# Patient Record
Sex: Female | Born: 1960 | Race: Black or African American | Hispanic: No | Marital: Single | Smoking: Never smoker
Health system: Southern US, Community
[De-identification: ages and names within clinical notes are randomized; demographics above are authoritative.]

## PROBLEM LIST (undated history)

## (undated) DIAGNOSIS — I10 Essential (primary) hypertension: Secondary | ICD-10-CM

## (undated) DIAGNOSIS — E119 Type 2 diabetes mellitus without complications: Secondary | ICD-10-CM

---

## 2005-04-21 ENCOUNTER — Ambulatory Visit: Payer: Self-pay | Admitting: General Practice

## 2006-04-21 ENCOUNTER — Ambulatory Visit: Payer: Self-pay | Admitting: General Practice

## 2007-04-27 ENCOUNTER — Ambulatory Visit: Payer: Self-pay | Admitting: Endocrinology

## 2008-06-22 ENCOUNTER — Ambulatory Visit: Payer: Self-pay | Admitting: Endocrinology

## 2008-12-17 ENCOUNTER — Emergency Department: Payer: Self-pay | Admitting: Internal Medicine

## 2009-08-11 ENCOUNTER — Ambulatory Visit: Payer: Self-pay | Admitting: Family Medicine

## 2009-12-27 ENCOUNTER — Ambulatory Visit: Payer: Self-pay | Admitting: Family Medicine

## 2010-08-13 ENCOUNTER — Ambulatory Visit: Payer: Self-pay | Admitting: Internal Medicine

## 2010-09-10 ENCOUNTER — Ambulatory Visit: Payer: Self-pay | Admitting: Internal Medicine

## 2010-10-10 ENCOUNTER — Ambulatory Visit: Payer: Self-pay | Admitting: Internal Medicine

## 2011-01-02 ENCOUNTER — Ambulatory Visit: Payer: Self-pay | Admitting: Family Medicine

## 2011-07-12 ENCOUNTER — Ambulatory Visit: Payer: Self-pay | Admitting: Internal Medicine

## 2011-07-14 ENCOUNTER — Ambulatory Visit: Payer: Self-pay

## 2011-07-16 ENCOUNTER — Ambulatory Visit: Payer: Self-pay | Admitting: Family Medicine

## 2011-07-18 ENCOUNTER — Ambulatory Visit: Payer: Self-pay

## 2012-01-09 ENCOUNTER — Ambulatory Visit: Payer: Self-pay

## 2012-04-01 ENCOUNTER — Ambulatory Visit: Payer: Self-pay | Admitting: Medical

## 2014-01-31 ENCOUNTER — Ambulatory Visit: Payer: Self-pay | Admitting: Internal Medicine

## 2014-02-08 ENCOUNTER — Ambulatory Visit: Payer: Self-pay | Admitting: Internal Medicine

## 2014-02-28 ENCOUNTER — Ambulatory Visit: Payer: Self-pay | Admitting: Family Medicine

## 2014-03-10 ENCOUNTER — Ambulatory Visit: Payer: Self-pay | Admitting: Internal Medicine

## 2014-12-15 ENCOUNTER — Ambulatory Visit: Payer: Self-pay | Admitting: Gastroenterology

## 2016-04-15 ENCOUNTER — Other Ambulatory Visit: Payer: Self-pay | Admitting: Family Medicine

## 2016-04-15 DIAGNOSIS — Z1231 Encounter for screening mammogram for malignant neoplasm of breast: Secondary | ICD-10-CM

## 2017-03-11 ENCOUNTER — Ambulatory Visit
Admission: RE | Admit: 2017-03-11 | Discharge: 2017-03-11 | Disposition: A | Discharge status: Home / Self Care | Payer: BLUE CROSS/BLUE SHIELD | Source: Ambulatory Visit | Attending: Family Medicine | Admitting: Family Medicine

## 2017-03-11 ENCOUNTER — Encounter: Payer: Self-pay | Admitting: Radiology

## 2017-03-11 DIAGNOSIS — Z1231 Encounter for screening mammogram for malignant neoplasm of breast: Secondary | ICD-10-CM | POA: Diagnosis not present

## 2018-04-26 ENCOUNTER — Other Ambulatory Visit: Payer: Self-pay | Admitting: Family Medicine

## 2018-04-26 ENCOUNTER — Other Ambulatory Visit: Payer: Self-pay | Admitting: Nurse Practitioner

## 2019-09-12 ENCOUNTER — Other Ambulatory Visit: Payer: Self-pay | Admitting: Gerontology

## 2019-09-12 DIAGNOSIS — Z1231 Encounter for screening mammogram for malignant neoplasm of breast: Secondary | ICD-10-CM

## 2019-12-16 ENCOUNTER — Ambulatory Visit
Admission: EM | Admit: 2019-12-16 | Discharge: 2019-12-16 | Disposition: A | Discharge status: Home / Self Care | Payer: BC Managed Care – PPO | Attending: Family Medicine | Admitting: Family Medicine

## 2019-12-16 ENCOUNTER — Other Ambulatory Visit: Payer: Self-pay

## 2019-12-16 ENCOUNTER — Encounter: Payer: Self-pay | Admitting: Emergency Medicine

## 2019-12-16 DIAGNOSIS — Z20822 Contact with and (suspected) exposure to covid-19: Secondary | ICD-10-CM | POA: Diagnosis not present

## 2019-12-16 HISTORY — DX: Type 2 diabetes mellitus without complications: E11.9

## 2019-12-16 HISTORY — DX: Essential (primary) hypertension: I10

## 2019-12-16 NOTE — Discharge Instructions (Signed)
Results available in 24 to 48 hours.  Stay home.  Take care  Dr. Rashawnda Gaba   

## 2019-12-16 NOTE — ED Provider Notes (Signed)
[facility]    CSN: [id number] Arrival date & time: 12/16/19  0935      History   Chief Complaint Chief Complaint  Patient presents with  . COVID Test    COVID Exposure no symptoms.   HPI  59 year old female presents for Covid testing.  Patient states that she was recently exposed to her niece on Saturday.  Niece tested positive for COVID-19 on Monday.  Patient states that she is feeling well and has no symptoms.  Given her exposure, she desires testing.  No other complaints or concerns at this time.  Past Medical History:  Diagnosis Date  . Diabetes mellitus without complication ([place])   . Hypertension    Home Medications    Prior to Admission medications   Medication Sig Start Date End Date Taking? Authorizing Provider  aspirin 81 MG chewable tablet Chew by mouth.   Yes [provider]  canagliflozin (INVOKANA) 300 MG TABS tablet Take by mouth. 07/25/19  Yes [provider]  Cholecalciferol 50 MCG (2000 UT) TABS Take by mouth. 09/12/19  Yes [provider]  glimepiride (AMARYL) 2 MG tablet Take 2 tablets in am (4 mg total) and 1 tablet in pm (2 mg total) 11/29/19  Yes [provider]  hydrochlorothiazide (HYDRODIURIL) 25 MG tablet Take by mouth. 07/06/19  Yes [provider]  lisinopril (ZESTRIL) 40 MG tablet Take by mouth. 07/01/19  Yes [provider]  metFORMIN (GLUCOPHAGE) 1000 MG tablet Take by mouth. 01/12/19  Yes [provider]  rosuvastatin (CRESTOR) 5 MG tablet Take by mouth. 09/05/19 09/04/20 Yes [provider]  Semaglutide, 1 MG/DOSE, 2 MG/1.5ML SOPN Inject into the skin. 11/23/18  Yes [provider]  vitamin B-12 (CYANOCOBALAMIN) 500 MCG tablet Take by mouth. 09/12/19  Yes [provider]    Family History Family History  Problem Relation Age of Onset  . Diabetes Mother   . Stroke Father   . Cancer Father   . Breast cancer Neg Hx     Social History Social  History   Tobacco Use  . Smoking status: Never Smoker  . Smokeless tobacco: Never Used  Substance Use Topics  . Alcohol use: Yes  . Drug use: Never     Allergies   Patient has no known allergies.   Review of Systems Review of Systems  Constitutional: Negative.   HENT: Negative.   Respiratory: Negative.    Physical Exam Triage Vital Signs ED Triage Vitals  Enc Vitals Group     BP 12/16/19 0954 137/81     Pulse Rate 12/16/19 0954 84     Resp 12/16/19 0954 14     Temp 12/16/19 0954 98.5 F (36.9 C)     Temp Source 12/16/19 0954 Oral     SpO2 12/16/19 0954 97 %     Weight 12/16/19 0949 213 lb (96.6 kg)     Height 12/16/19 0949 5\' 2"  (1.575 m)     Head Circumference --      Peak Flow --      Pain Score 12/16/19 0949 0     Pain Loc --      Pain Edu? --      Excl. in [place]? --    Updated Vital Signs BP 137/81 (BP Location: Left Arm)   Pulse 84   Temp 98.5 F (36.9 C) (Oral)   Resp 14   Ht 5\' 2"  (1.575 m)   Wt 96.6 kg   SpO2 97%  BMI 38.96 kg/m   Visual Acuity Right Eye Distance:   Left Eye Distance:   Bilateral Distance:    Right Eye Near:   Left Eye Near:    Bilateral Near:     Physical Exam Constitutional:      General: She is not in acute distress.    Appearance: Normal appearance. She is obese. She is not ill-appearing.  HENT:     Head: Normocephalic and atraumatic.  Eyes:     General:        Right eye: No discharge.        Left eye: No discharge.     Conjunctiva/sclera: Conjunctivae normal.  Cardiovascular:     Rate and Rhythm: Normal rate and regular rhythm.     Heart sounds: No murmur.  Pulmonary:     Effort: Pulmonary effort is normal.     Breath sounds: Normal breath sounds. No wheezing, rhonchi or rales.  Neurological:     Mental Status: She is alert.  Psychiatric:        Mood and Affect: Mood normal.        Behavior: Behavior normal.    UC Treatments / Results  Labs (all labs ordered are listed, but only abnormal results are  displayed) Labs Reviewed  NOVEL CORONAVIRUS, NAA (HOSP ORDER, SEND-OUT TO REF LAB; TAT 18-24 HRS)    EKG   Radiology No results found.  Procedures Procedures (including critical care time)  Medications Ordered in UC Medications - No data to display  Initial Impression / Assessment and Plan / UC Course  I have reviewed the triage vital signs and the nursing notes.  Pertinent labs & imaging results that were available during my care of the patient were reviewed by me and considered in my medical decision making (see chart for details).    59 year old female presents for Covid testing.  Asymptomatic.  Recent exposure.  Awaiting test results.  Final Clinical Impressions(s) / UC Diagnoses   Final diagnoses:  Encounter for screening laboratory testing for COVID-19 virus in asymptomatic patient     Discharge Instructions     Results available in 24 to 48 hours.  Stay home.  Take care  Dr. Adriana Simas     ED Prescriptions    None     PDMP not reviewed this encounter.   Tommie Sams, [place] 12/16/19 1025

## 2019-12-16 NOTE — ED Triage Notes (Signed)
Patient states that her niece tested positive on Monday for COVID and patient states that she has been around her.  Patient states she is here for a COVID test.  Patient denies any symptoms.

## 2019-12-17 LAB — NOVEL CORONAVIRUS, NAA (HOSP ORDER, SEND-OUT TO REF LAB; TAT 18-24 HRS): SARS-CoV-2, NAA: NOT DETECTED

## 2020-09-13 ENCOUNTER — Other Ambulatory Visit: Payer: Self-pay | Admitting: Gerontology

## 2020-09-13 DIAGNOSIS — Z1231 Encounter for screening mammogram for malignant neoplasm of breast: Secondary | ICD-10-CM

## 2020-12-11 ENCOUNTER — Ambulatory Visit
Admission: RE | Admit: 2020-12-11 | Discharge: 2020-12-11 | Disposition: A | Discharge status: Home / Self Care | Payer: BC Managed Care – PPO | Source: Ambulatory Visit | Attending: Gerontology | Admitting: Gerontology

## 2020-12-11 ENCOUNTER — Other Ambulatory Visit: Payer: Self-pay

## 2020-12-11 DIAGNOSIS — Z1231 Encounter for screening mammogram for malignant neoplasm of breast: Secondary | ICD-10-CM | POA: Diagnosis not present

## 2020-12-18 ENCOUNTER — Other Ambulatory Visit: Payer: Self-pay | Admitting: Gerontology

## 2020-12-18 DIAGNOSIS — N6320 Unspecified lump in the left breast, unspecified quadrant: Secondary | ICD-10-CM

## 2020-12-18 DIAGNOSIS — R928 Other abnormal and inconclusive findings on diagnostic imaging of breast: Secondary | ICD-10-CM

## 2020-12-21 ENCOUNTER — Other Ambulatory Visit: Payer: Self-pay

## 2020-12-21 ENCOUNTER — Ambulatory Visit
Admission: RE | Admit: 2020-12-21 | Discharge: 2020-12-21 | Disposition: A | Discharge status: Home / Self Care | Payer: BC Managed Care – PPO | Source: Ambulatory Visit | Attending: Gerontology | Admitting: Gerontology

## 2020-12-21 DIAGNOSIS — N6320 Unspecified lump in the left breast, unspecified quadrant: Secondary | ICD-10-CM

## 2020-12-21 DIAGNOSIS — R928 Other abnormal and inconclusive findings on diagnostic imaging of breast: Secondary | ICD-10-CM

## 2021-09-17 ENCOUNTER — Other Ambulatory Visit: Payer: Self-pay | Admitting: Gerontology

## 2021-09-17 DIAGNOSIS — Z1231 Encounter for screening mammogram for malignant neoplasm of breast: Secondary | ICD-10-CM

## 2022-06-10 IMAGING — MG MM DIGITAL DIAGNOSTIC UNILAT*L* W/ TOMO W/ CAD
8 series · 9 of 24 positions shown · non-contrast
Comparison: Previous exam(s).

CLINICAL DATA: Patient was recalled from screening mammogram for a
possible mass in the left breast.

EXAM:
DIGITAL DIAGNOSTIC UNILATERAL LEFT MAMMOGRAM WITH TOMOSYNTHESIS AND
CAD
TECHNIQUE: Left digital diagnostic mammography and breast tomosynthesis was
performed. The images were evaluated with computer-aided detection.

[L MLO synth-2D]
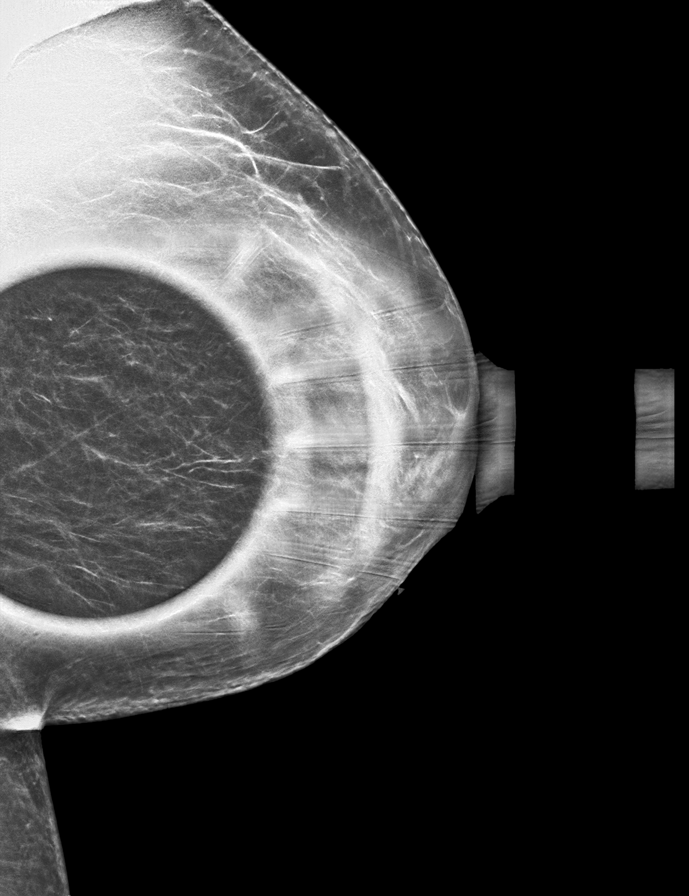

[L ML synth-2D]
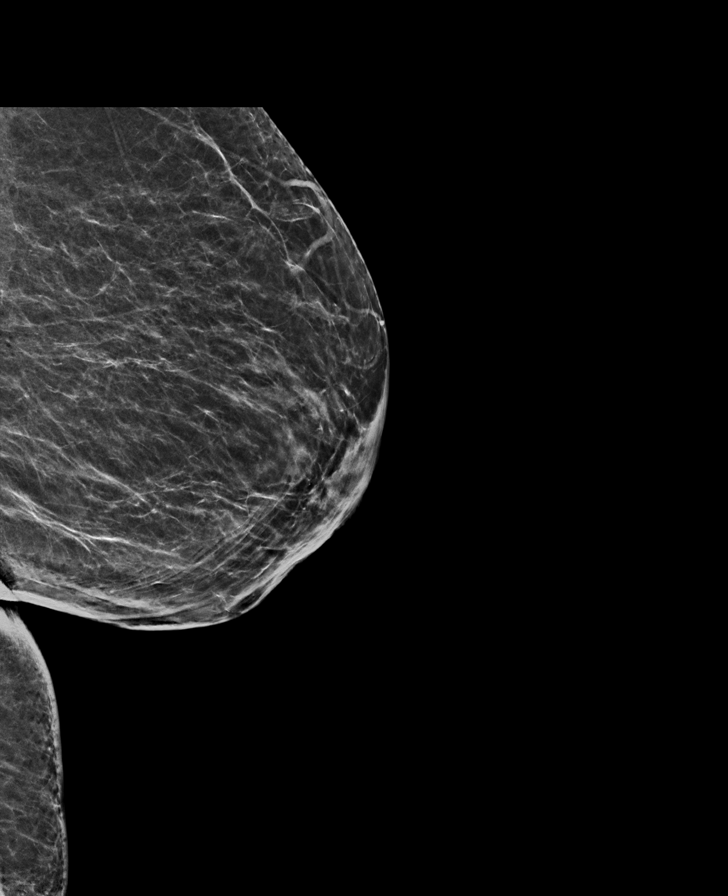

[L CC synth-2D (1 of 2)]
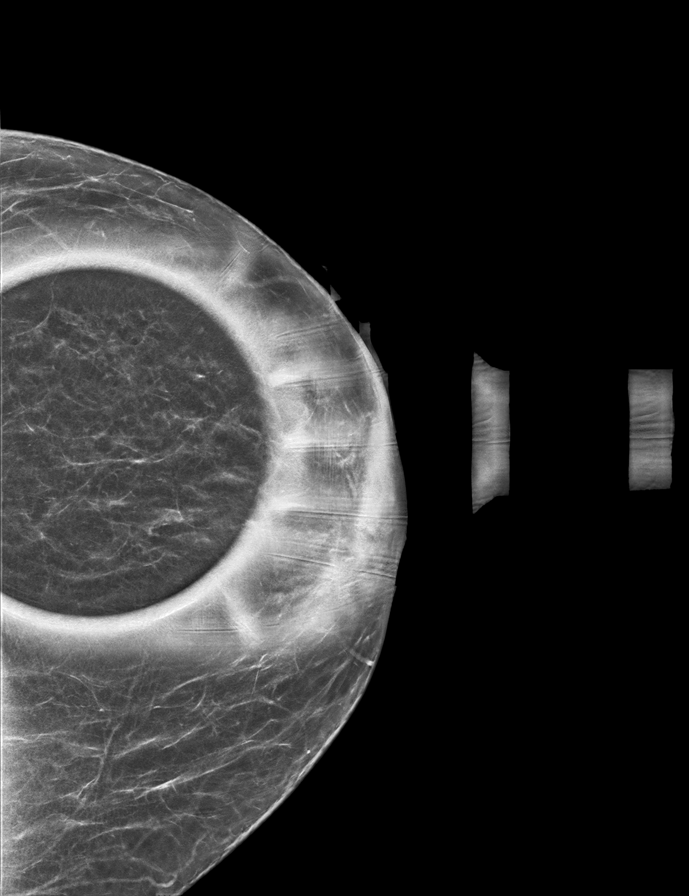

[L CC synth-2D (2 of 2)]
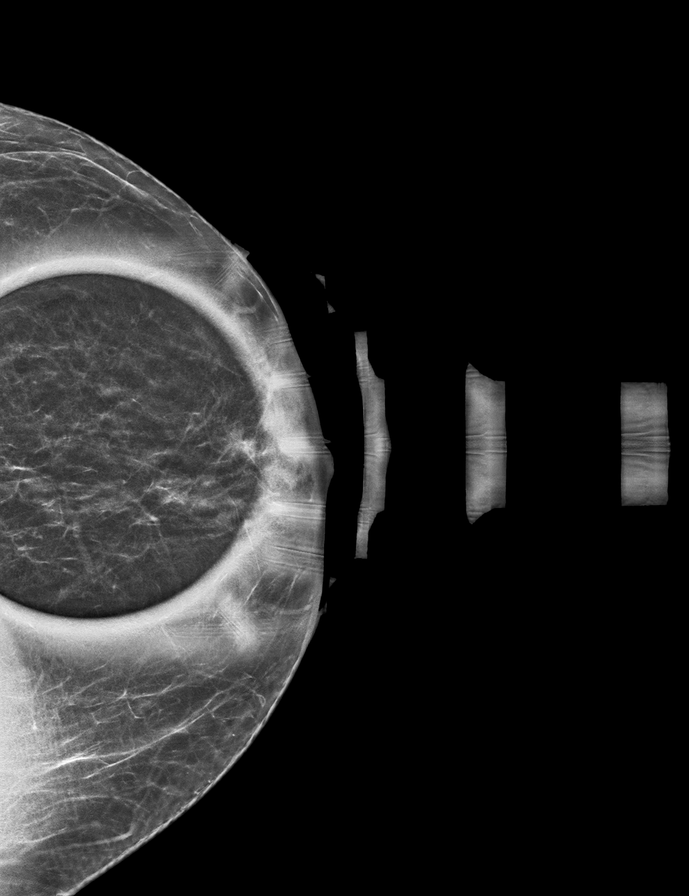

[L CC tomo · 2 of 47 frames shown (1 of 2)]
[frame 16/47]
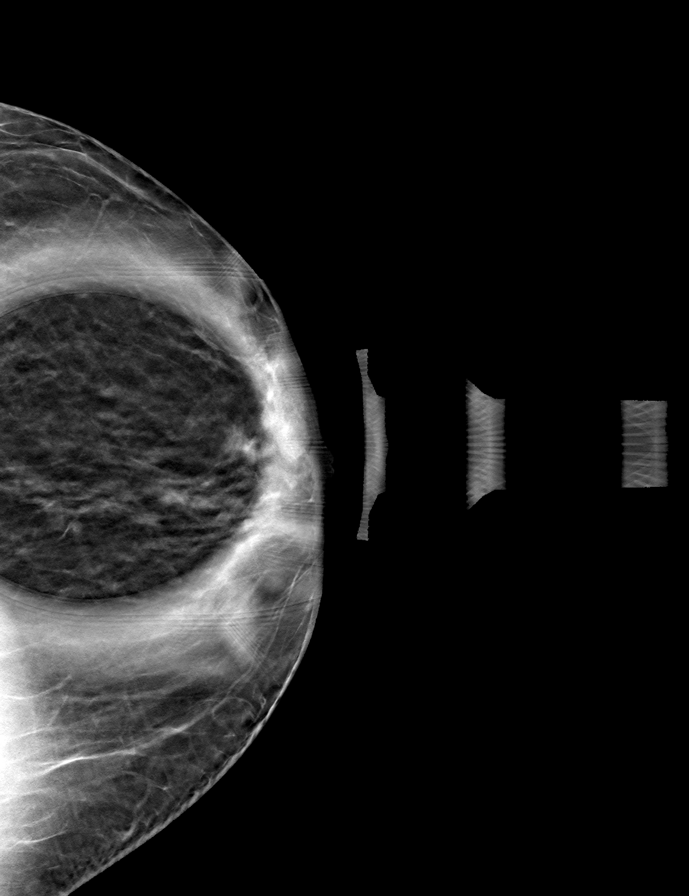
[frame 24/47]
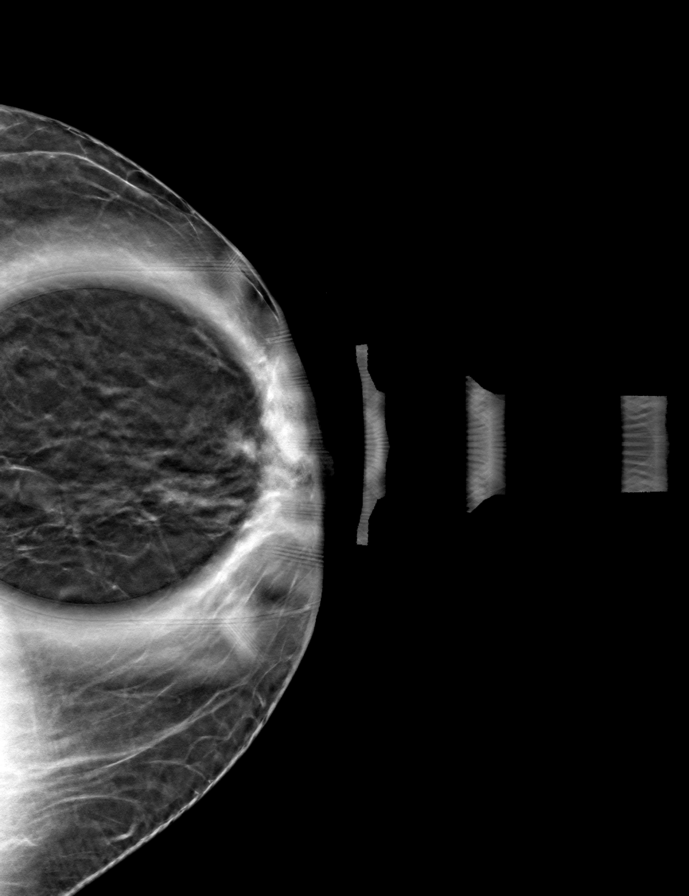

[L MLO tomo · tomo slice 29/56.0]
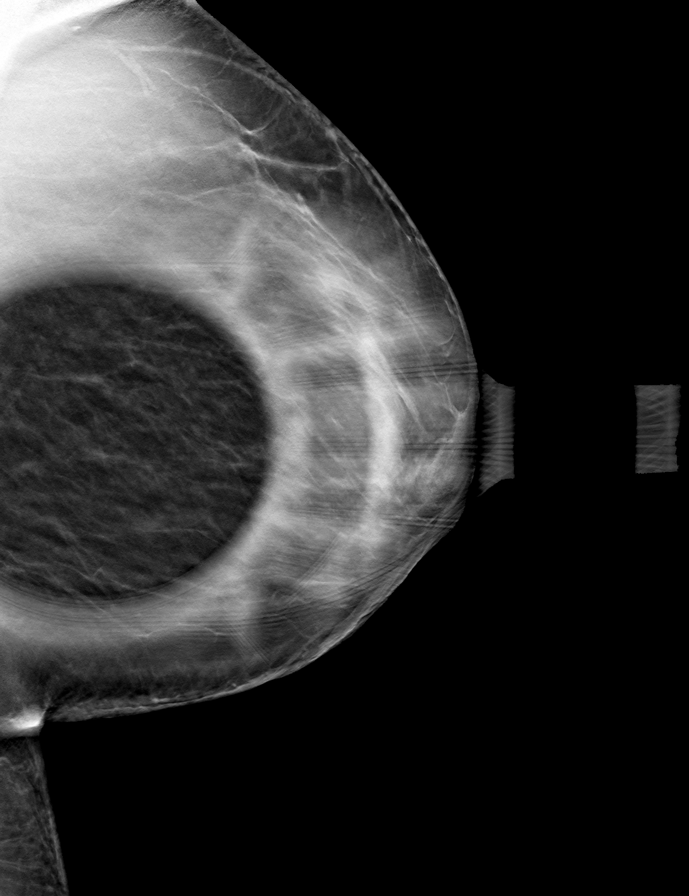

[L ML tomo · tomo slice 31/61.0]
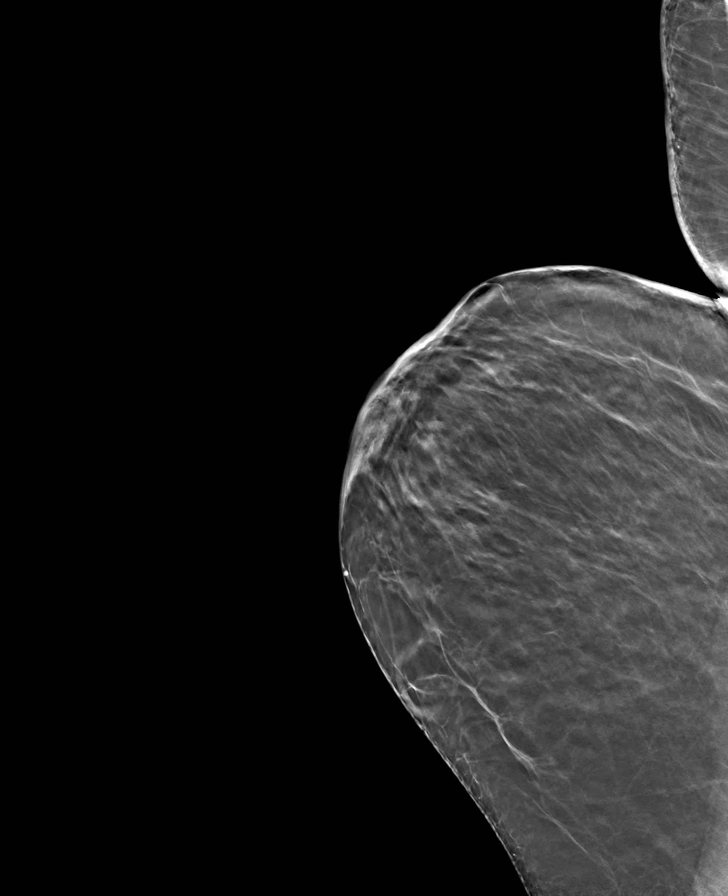

[L CC tomo (2 of 2) · tomo slice 26/51.0]
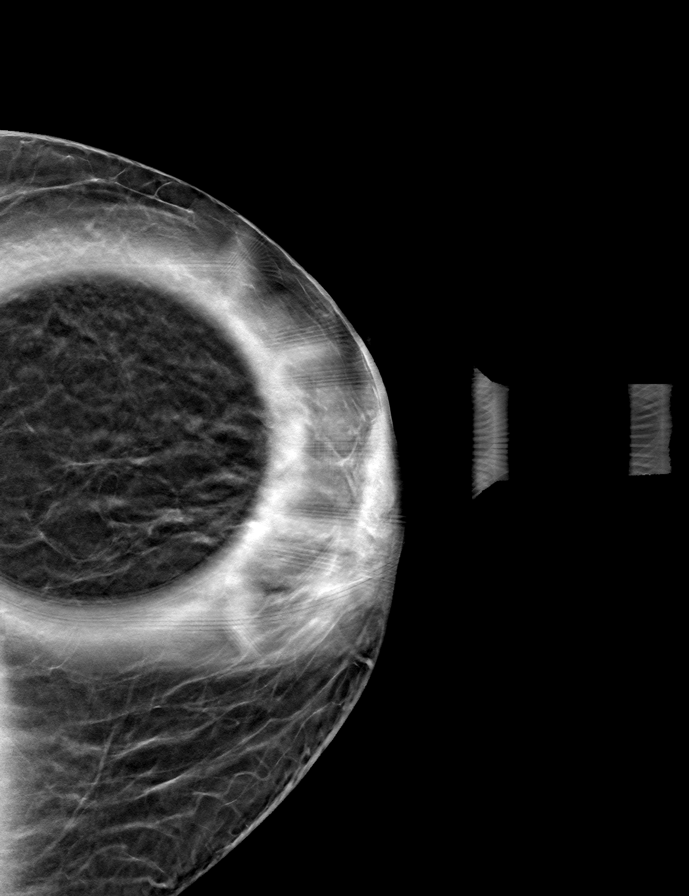

[9 of 24 positions shown; findings below may reference images not displayed]

ACR Breast Density Category b: There are scattered areas of
fibroglandular density.
FINDINGS: Additional imaging of the left breast was performed. No persistent
mass, distortion or malignant type microcalcifications identified.
IMPRESSION: No evidence of malignancy in the left breast.

RECOMMENDATION:
Bilateral screening mammogram in 1 year is recommended.

I have discussed the findings and recommendations with the patient.
If applicable, a reminder letter will be sent to the patient
regarding the next appointment.

BI-RADS CATEGORY  1: Negative.

## 2023-09-23 ENCOUNTER — Other Ambulatory Visit: Payer: Self-pay | Admitting: Gerontology

## 2023-09-23 DIAGNOSIS — Z1231 Encounter for screening mammogram for malignant neoplasm of breast: Secondary | ICD-10-CM

## 2024-02-29 ENCOUNTER — Ambulatory Visit
Admission: RE | Admit: 2024-02-29 | Discharge: 2024-02-29 | Disposition: A | Discharge status: Home / Self Care | Source: Ambulatory Visit | Attending: Gerontology | Admitting: Gerontology

## 2024-02-29 DIAGNOSIS — Z1231 Encounter for screening mammogram for malignant neoplasm of breast: Secondary | ICD-10-CM | POA: Insufficient documentation

## 2024-11-24 ENCOUNTER — Ambulatory Visit: Admitting: Anesthesiology

## 2024-11-24 ENCOUNTER — Encounter: Admission: RE | Disposition: A | Payer: Self-pay | Source: Home / Self Care | Attending: Gastroenterology

## 2024-11-24 ENCOUNTER — Encounter: Payer: Self-pay | Admitting: Gastroenterology

## 2024-11-24 ENCOUNTER — Ambulatory Visit
Admission: RE | Admit: 2024-11-24 | Discharge: 2024-11-24 | Disposition: A | Discharge status: Home / Self Care | Attending: Gastroenterology | Admitting: Gastroenterology

## 2024-11-24 DIAGNOSIS — Z539 Procedure and treatment not carried out, unspecified reason: Secondary | ICD-10-CM | POA: Diagnosis not present

## 2024-11-24 DIAGNOSIS — Z1211 Encounter for screening for malignant neoplasm of colon: Secondary | ICD-10-CM | POA: Diagnosis present

## 2024-11-24 HISTORY — PX: COLONOSCOPY: SHX5424

## 2024-11-24 MED ORDER — LIDOCAINE HCL (PF) 2 % IJ SOLN
INTRAMUSCULAR | Status: AC
  Filled 2024-11-24: qty 5

## 2024-11-24 MED ORDER — PROPOFOL 1000 MG/100ML IV EMUL
INTRAVENOUS | Status: AC
  Filled 2024-11-24: qty 100

## 2024-11-24 NOTE — H&P (Signed)
 Patient still with brown bowel movements. Colonoscopy to be rescheduled with better prep.  Elspeth EMERSON Jungling, DO [facility]

## 2024-11-24 NOTE — OR Nursing (Signed)
 Pt arrived to floor stool dark brown liquid. Pt verbalized she was not cleaned. Pt spoke with dr onita and she willl reschedule
